# Patient Record
Sex: Male | Born: 1980 | Race: Black or African American | Hispanic: No | Marital: Single | Smoking: Current every day smoker
Health system: Southern US, Community
[De-identification: ages and names within clinical notes are randomized; demographics above are authoritative.]

## PROBLEM LIST (undated history)

## (undated) DIAGNOSIS — J45909 Unspecified asthma, uncomplicated: Secondary | ICD-10-CM

---

## 2013-10-11 ENCOUNTER — Emergency Department (INDEPENDENT_AMBULATORY_CARE_PROVIDER_SITE_OTHER)
Admission: EM | Admit: 2013-10-11 | Discharge: 2013-10-11 | Disposition: A | Discharge status: Home / Self Care | Payer: PRIVATE HEALTH INSURANCE | Source: Home / Self Care | Attending: Family Medicine | Admitting: Family Medicine

## 2013-10-11 ENCOUNTER — Encounter (HOSPITAL_COMMUNITY): Payer: Self-pay | Admitting: Emergency Medicine

## 2013-10-11 ENCOUNTER — Emergency Department (INDEPENDENT_AMBULATORY_CARE_PROVIDER_SITE_OTHER): Payer: PRIVATE HEALTH INSURANCE

## 2013-10-11 DIAGNOSIS — J189 Pneumonia, unspecified organism: Secondary | ICD-10-CM

## 2013-10-11 HISTORY — DX: Unspecified asthma, uncomplicated: J45.909

## 2013-10-11 MED ORDER — LEVOFLOXACIN 750 MG PO TABS: 750.0000 mg | ORAL_TABLET | Freq: Every day | ORAL | Status: AC

## 2013-10-11 MED ORDER — ACETAMINOPHEN 325 MG PO TABS
ORAL_TABLET | ORAL | Status: AC
  Filled 2013-10-11: qty 2

## 2013-10-11 NOTE — ED Notes (Signed)
4 day history of chest pain, headache, general aches, cough.

## 2013-10-11 NOTE — Discharge Instructions (Signed)

## 2013-10-12 ENCOUNTER — Encounter (HOSPITAL_COMMUNITY): Payer: Self-pay | Admitting: Emergency Medicine

## 2013-10-12 NOTE — ED Provider Notes (Signed)
CSN: [id number]     Arrival date & time 10/11/13  1924 History   First MD Initiated Contact with Patient 10/11/13 2053     Chief Complaint  Patient presents with  . URI  . Fever   (Consider location/radiation/quality/duration/timing/severity/associated sxs/prior Treatment) Patient is a 33 y.o. male presenting with cough. The history is provided by the patient.  Cough Cough characteristics:  Non-productive Severity:  Severe Onset quality:  Gradual Duration:  4 days Timing:  Constant Progression:  Worsening Chronicity:  New Smoker: yes   Context: not upper respiratory infection   Relieved by:  None tried Worsened by:  Smoking and deep breathing Ineffective treatments:  None tried Associated symptoms: chest pain, chills, fever, myalgias and shortness of breath   Associated symptoms: no ear pain, no rhinorrhea, no sinus congestion, no sore throat and no wheezing     Past Medical History  Diagnosis Date  . Asthma    History reviewed. No pertinent past surgical history. History reviewed. No pertinent family history. History  Substance Use Topics  . Smoking status: Current Every Day Smoker  . Smokeless tobacco: Not on file  . Alcohol Use: Yes    Review of Systems  Constitutional: Positive for fever and chills.  HENT: Negative for ear pain, rhinorrhea and sore throat.   Respiratory: Positive for cough and shortness of breath. Negative for wheezing.   Cardiovascular: Positive for chest pain.  Musculoskeletal: Positive for myalgias.    Allergies  Review of patient's allergies indicates no known allergies.  Home Medications   Current Outpatient Rx  Name  Route  Sig  Dispense  Refill  . ALBUTEROL IN   Inhalation   Inhale into the lungs.         Marland Kitchen levofloxacin (LEVAQUIN) 750 MG tablet   Oral   Take 1 tablet (750 mg total) by mouth daily.   7 tablet   0    BP 115/68  Pulse 88  Temp(Src) 99.8 F (37.7 C)  Resp 18  SpO2 100% Physical Exam  Constitutional: He  appears well-developed and well-nourished. He appears ill.  HENT:  Right Ear: Tympanic membrane, external ear and ear canal normal. No decreased hearing is noted.  Left Ear: Tympanic membrane, external ear and ear canal normal.  Nose: Nose normal.  Mouth/Throat: Uvula is midline, oropharynx is clear and moist and mucous membranes are normal.  Cardiovascular: Normal rate and regular rhythm.   Pulmonary/Chest: Effort normal and breath sounds normal. He exhibits no tenderness.  Non productive coughing    ED Course  Procedures (including critical care time) Labs Review Labs Reviewed - No data to display Imaging Review Dg Chest 2 View  10/11/2013   CLINICAL DATA:  Fever and chest pain  EXAM: CHEST  2 VIEW  COMPARISON:  None.  FINDINGS: There is airspace consolidation in the left lower lobe posteriorly. Lungs elsewhere are clear. Heart size and pulmonary vascularity are normal. No adenopathy. No bone lesions.  IMPRESSION: Posterior left base airspace consolidation.   Electronically Signed   By: Lowella Grip M.D.   On: 10/11/2013 20:41     MDM   1. Community acquired pneumonia   rx levaquin 750mg  daily #7 Discussed smoking cessation     Carvel Getting, NP 10/12/13 [id number]  Carvel Getting, NP 10/12/13 [phone]

## 2013-10-12 NOTE — ED Provider Notes (Signed)
Medical screening examination/treatment/procedure(s) were performed by resident physician or non-physician practitioner and as supervising physician I was immediately available for consultation/collaboration.   Pauline Good MD.   Billy Fischer, MD 10/12/13 [phone]

## 2013-12-25 ENCOUNTER — Emergency Department (INDEPENDENT_AMBULATORY_CARE_PROVIDER_SITE_OTHER)
Admission: EM | Admit: 2013-12-25 | Discharge: 2013-12-25 | Disposition: A | Discharge status: Home / Self Care | Payer: PRIVATE HEALTH INSURANCE | Source: Home / Self Care | Attending: Family Medicine | Admitting: Family Medicine

## 2013-12-25 ENCOUNTER — Encounter (HOSPITAL_COMMUNITY): Payer: Self-pay | Admitting: Emergency Medicine

## 2013-12-25 DIAGNOSIS — D179 Benign lipomatous neoplasm, unspecified: Secondary | ICD-10-CM

## 2013-12-25 DIAGNOSIS — R438 Other disturbances of smell and taste: Secondary | ICD-10-CM

## 2013-12-25 DIAGNOSIS — R439 Unspecified disturbances of smell and taste: Secondary | ICD-10-CM

## 2013-12-25 MED ORDER — FLUTICASONE PROPIONATE 50 MCG/ACT NA SUSP: 2.0000 | Freq: Two times a day (BID) | NASAL | Status: AC

## 2013-12-25 NOTE — ED Provider Notes (Signed)
Medical screening examination/treatment/procedure(s) were performed by resident physician or non-physician practitioner and as supervising physician I was immediately available for consultation/collaboration.   Pauline Good MD.   Billy Fischer, MD 12/25/13 [phone]

## 2013-12-25 NOTE — Discharge Instructions (Signed)
Lipoma  A lipoma is a noncancerous (benign) tumor composed of fat cells. They are usually found under the skin (subcutaneous). A lipoma may occur in any tissue of the body that contains fat. Common areas for lipomas to appear include the back, shoulders, buttocks, and thighs. Lipomas are a very common soft tissue growth. They are soft and grow slowly. Most problems caused by a lipoma depend on where it is growing.  DIAGNOSIS   A lipoma can be diagnosed with a physical exam. These tumors rarely become cancerous, but radiographic studies can help determine this for certain. Studies used may include:  · Computerized X-ray scans (CT or CAT scan).  · Computerized magnetic scans (MRI).  TREATMENT   Small lipomas that are not causing problems may be watched. If a lipoma continues to enlarge or causes problems, removal is often the best treatment. Lipomas can also be removed to improve appearance. Surgery is done to remove the fatty cells and the surrounding capsule. Most often, this is done with medicine that numbs the area (local anesthetic). The removed tissue is examined under a microscope to make sure it is not cancerous. Keep all follow-up appointments with your caregiver.  SEEK MEDICAL CARE IF:   · The lipoma becomes larger or hard.  · The lipoma becomes painful, red, or increasingly swollen. These could be signs of infection or a more serious condition.  Document Released: 06/11/2002 Document Revised: 09/13/2011 Document Reviewed: 11/21/2009  [employer]® Patient Information ©2015 [employer]. This information is not intended to replace advice given to you by your health care provider. Make sure you discuss any questions you have with your health care provider.

## 2013-12-25 NOTE — ED Provider Notes (Signed)
CSN: [id number]     Arrival date & time 12/25/13  1113 History   First MD Initiated Contact with Patient 12/25/13 1130     Chief Complaint  Patient presents with  . Cyst   (Consider location/radiation/quality/duration/timing/severity/associated sxs/prior Treatment) HPI Comments: 33 year old male presents complaining of having lost his sense of smell since he had pneumonia 2 months ago. He further explains this as feeling like he cannot smell things as well as he use to. He admits to some associated nasal congestion. No pain in his nose. Also he has had a lump on the left side of his orbit for about 3 months. It is soft and nontender. He tried to pop it but nothing would come out of it. It seems to be staying exactly the same, not getting any bigger   Past Medical History  Diagnosis Date  . Asthma    History reviewed. No pertinent past surgical history. No family history on file. History  Substance Use Topics  . Smoking status: Current Every Day Smoker  . Smokeless tobacco: Not on file  . Alcohol Use: Yes    Review of Systems  HENT: Positive for congestion.        Decreased sense of smell  Skin:       See history of present illness regarding lump on face  All other systems reviewed and are negative.   Allergies  Review of patient's allergies indicates no known allergies.  Home Medications   Prior to Admission medications   Medication Sig Start Date End Date Taking? Authorizing Provider  ALBUTEROL IN Inhale into the lungs.    Historical Provider, MD  fluticasone (FLONASE) 50 MCG/ACT nasal spray Place 2 sprays into both nostrils 2 (two) times daily. Decrease to 2 sprays/nostril daily after 5 days 12/25/13   Liam Graham, PA-C  levofloxacin (LEVAQUIN) 750 MG tablet Take 1 tablet (750 mg total) by mouth daily. 10/11/13   Carvel Getting, NP   BP 122/74  Pulse 60  Temp(Src) 98.6 F (37 C) (Oral)  Resp 12  SpO2 100% Physical Exam  Nursing note and vitals  reviewed. Constitutional: He is oriented to person, place, and time. He appears well-developed and well-nourished. No distress.  HENT:  Head: Normocephalic and atraumatic.    Nose: Mucosal edema present. Right sinus exhibits no maxillary sinus tenderness and no frontal sinus tenderness. Left sinus exhibits no maxillary sinus tenderness and no frontal sinus tenderness.  Mouth/Throat: Uvula is midline, oropharynx is clear and moist and mucous membranes are normal.  He is able to correctly identify a cherry smell  Cardiovascular: Normal rate, regular rhythm and normal heart sounds.   Pulmonary/Chest: Effort normal and breath sounds normal. No respiratory distress.  Neurological: He is alert and oriented to person, place, and time. Coordination normal.  Skin: Skin is warm and dry. No rash noted. He is not diaphoretic.  Psychiatric: He has a normal mood and affect. Judgment normal.    ED Course  Procedures (including critical care time) Labs Review Labs Reviewed - No data to display  Imaging Review No results found.   MDM   1. Lipoma   2. Decreased sense of smell    He may have some nasal congestion and inflammation protruding to the decreased sense of smell, will give a trial of Flonase, if that does not improve his symptoms he will followup with ENT. He will followup with plastic surgery for removal of the lipoma.  Meds ordered this encounter  Medications  .  fluticasone (FLONASE) 50 MCG/ACT nasal spray    Sig: Place 2 sprays into both nostrils 2 (two) times daily. Decrease to 2 sprays/nostril daily after 5 days    Dispense:  16 g    Refill:  2    Order Specific Question:  Supervising Provider    Answer:  Ihor Gully D [[id number]]       Liam Graham, PA-C 12/25/13 1306

## 2013-12-25 NOTE — ED Notes (Signed)
Patient c/o that he has lost his sense of smell since having pneumonia 2 months ago.   Patient also concerned for cyst beside left eye

## 2015-09-16 IMAGING — CR DG CHEST 2V
2 series · 2 of 2 positions shown · non-contrast
Comparison: None.

CLINICAL DATA: Fever and chest pain

EXAM:
CHEST  2 VIEW

[view not recorded (1 of 2)]
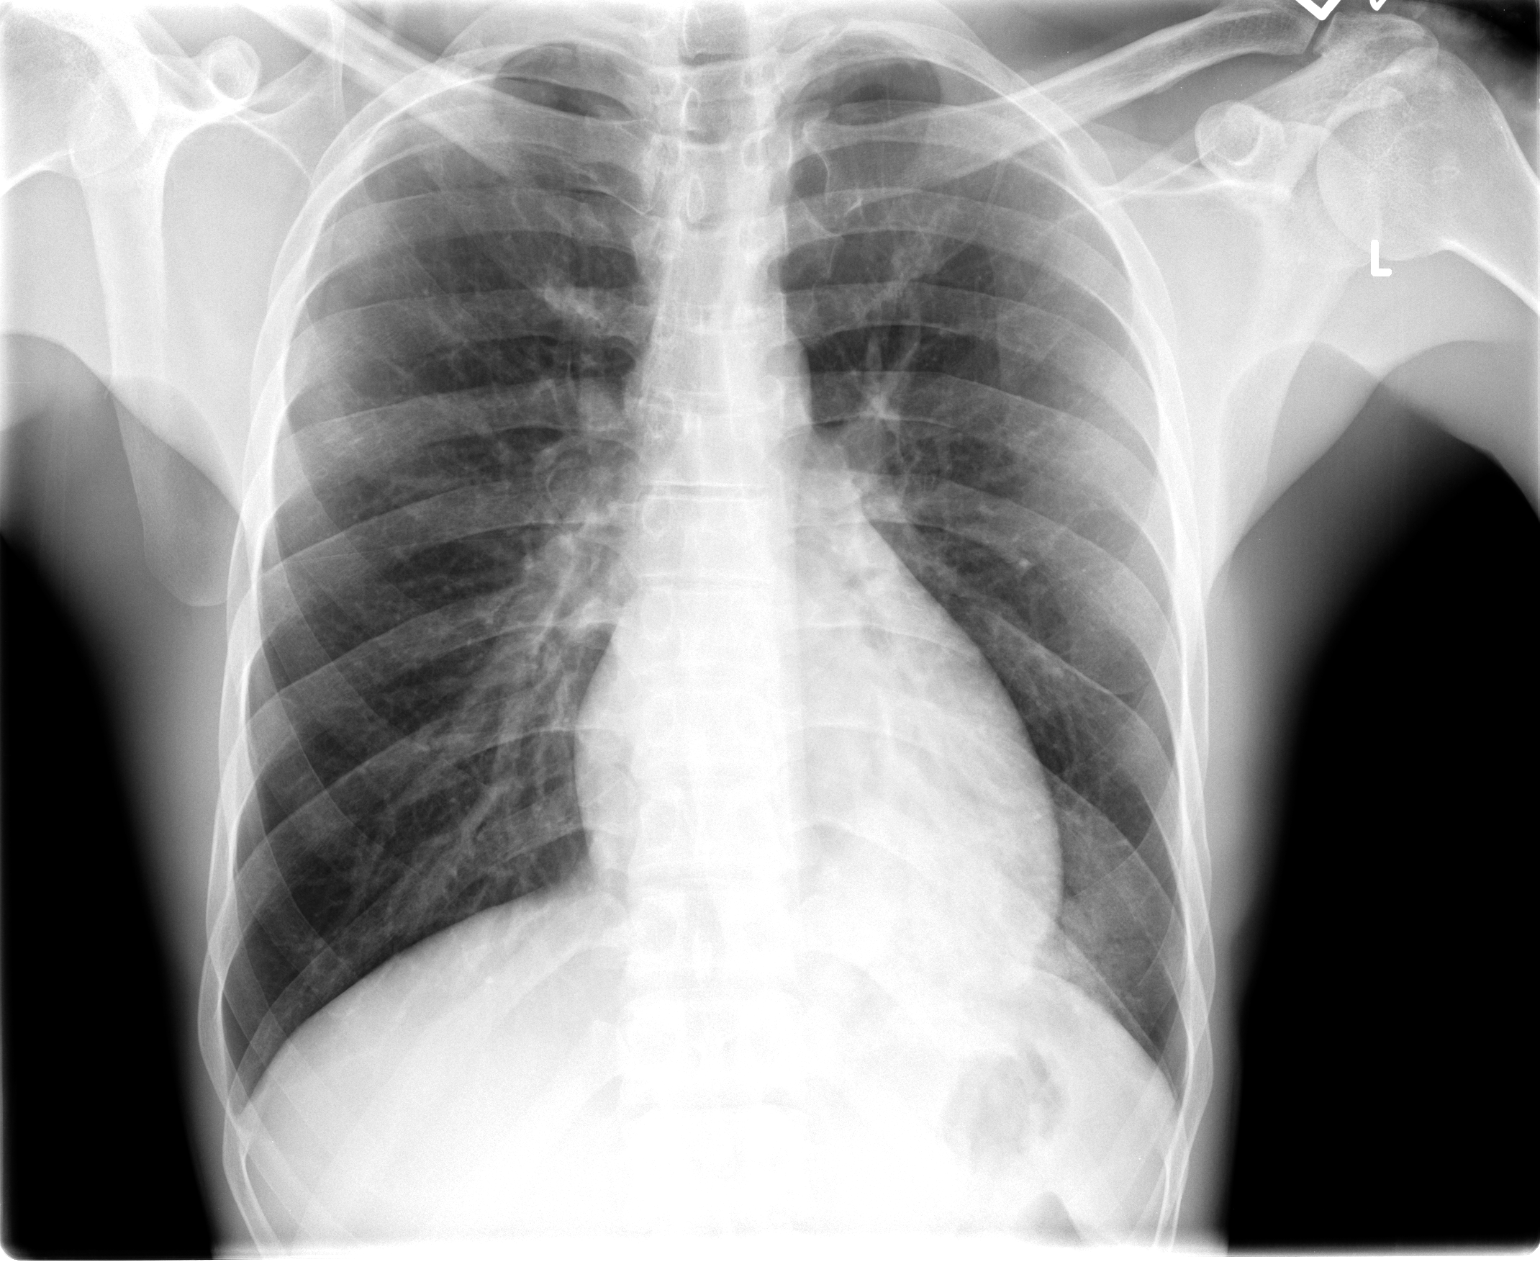

[view not recorded (2 of 2)]
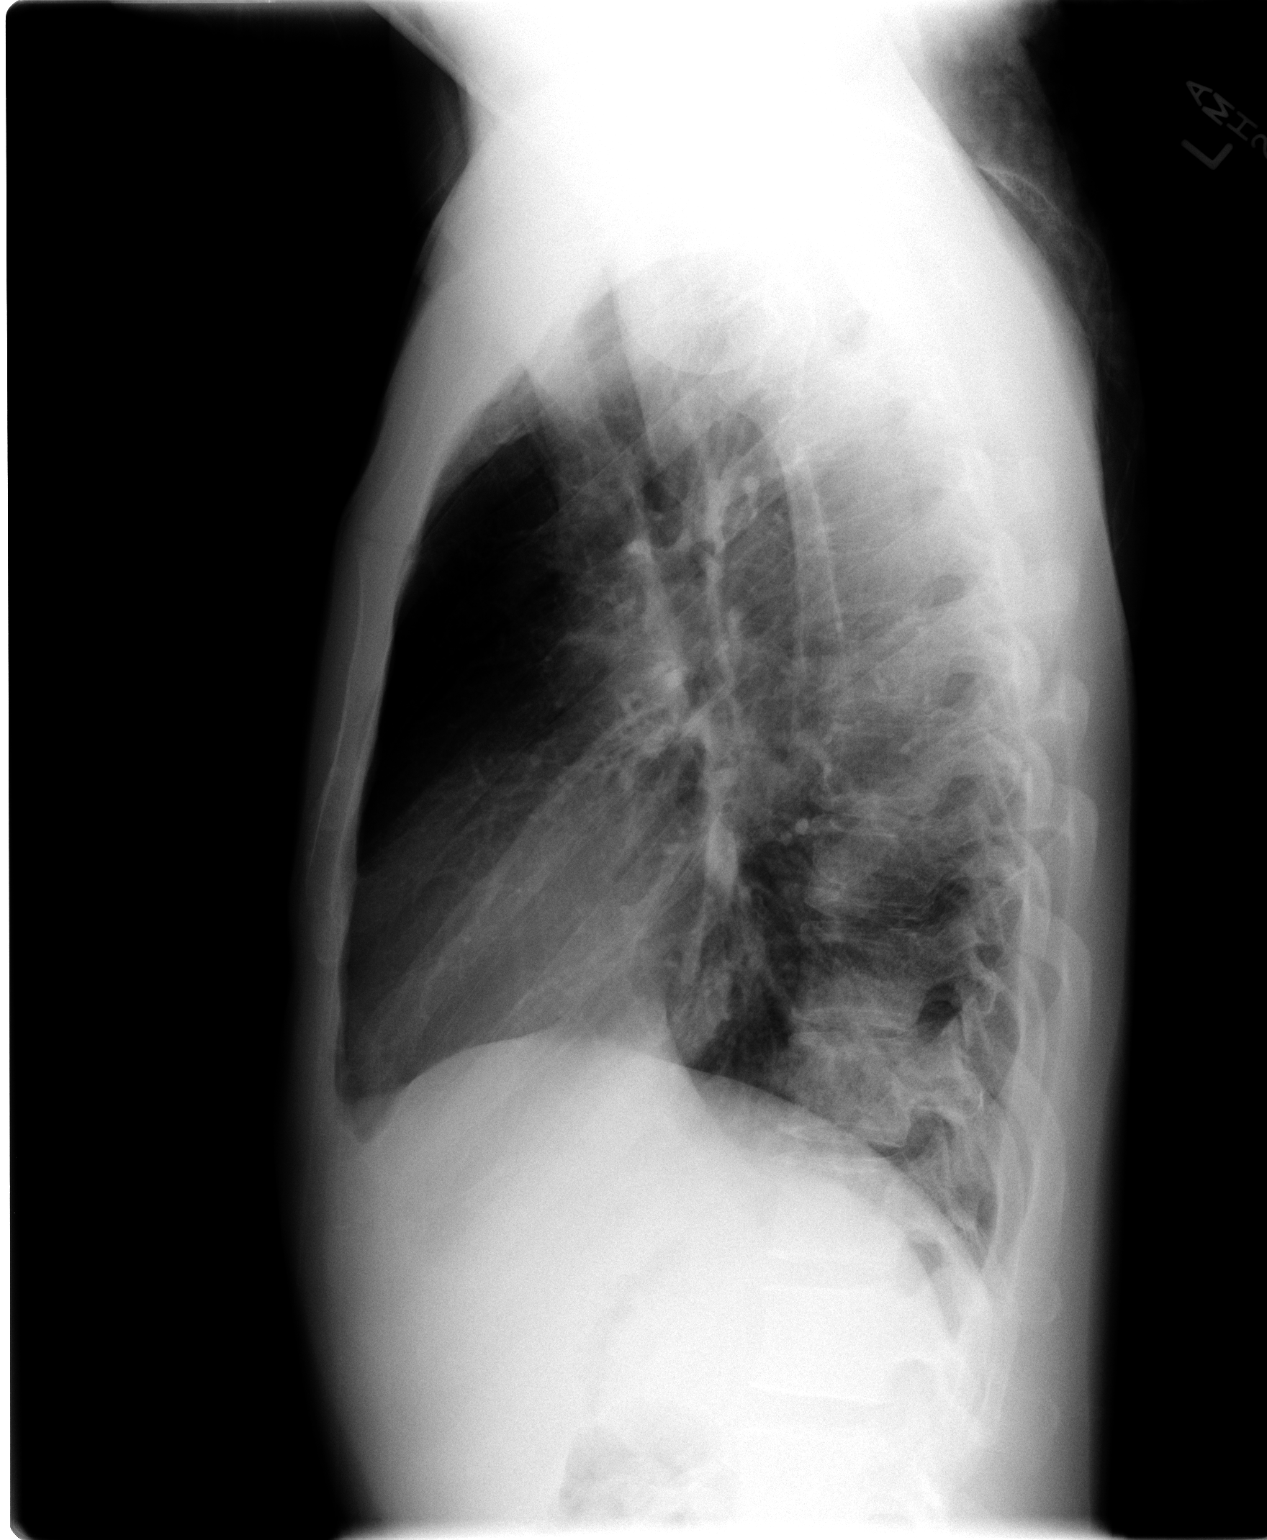

[2 of 2 positions shown; findings below may reference images not displayed]

FINDINGS: There is airspace consolidation in the left lower lobe posteriorly.
Lungs elsewhere are clear. Heart size and pulmonary vascularity are
normal. No adenopathy. No bone lesions.
IMPRESSION: Posterior left base airspace consolidation.
